# Patient Record
Sex: Female | Born: 1965 | Race: Black or African American | Hispanic: No | Marital: Married | Smoking: Never smoker
Health system: Southern US, Community
[De-identification: ages and names within clinical notes are randomized; demographics above are authoritative.]

## PROBLEM LIST (undated history)

## (undated) DIAGNOSIS — E876 Hypokalemia: Secondary | ICD-10-CM

## (undated) DIAGNOSIS — I10 Essential (primary) hypertension: Secondary | ICD-10-CM

## (undated) DIAGNOSIS — E7800 Pure hypercholesterolemia, unspecified: Secondary | ICD-10-CM

## (undated) DIAGNOSIS — E611 Iron deficiency: Secondary | ICD-10-CM

## (undated) HISTORY — PX: TUBAL LIGATION: SHX77

---

## 2009-05-13 HISTORY — PX: CARDIAC CATHETERIZATION: SHX172

## 2017-11-21 HISTORY — PX: BREAST REDUCTION SURGERY: SHX8

## 2018-01-21 ENCOUNTER — Emergency Department (HOSPITAL_COMMUNITY)
Admission: EM | Admit: 2018-01-21 | Discharge: 2018-01-21 | Disposition: A | Discharge status: Home / Self Care | Payer: Federal, State, Local not specified - PPO | Attending: Emergency Medicine | Admitting: Emergency Medicine

## 2018-01-21 ENCOUNTER — Encounter (HOSPITAL_COMMUNITY): Payer: Self-pay | Admitting: Emergency Medicine

## 2018-01-21 ENCOUNTER — Emergency Department (HOSPITAL_COMMUNITY): Payer: Federal, State, Local not specified - PPO

## 2018-01-21 DIAGNOSIS — K219 Gastro-esophageal reflux disease without esophagitis: Secondary | ICD-10-CM | POA: Diagnosis not present

## 2018-01-21 DIAGNOSIS — R112 Nausea with vomiting, unspecified: Secondary | ICD-10-CM | POA: Diagnosis present

## 2018-01-21 DIAGNOSIS — R202 Paresthesia of skin: Secondary | ICD-10-CM | POA: Diagnosis not present

## 2018-01-21 DIAGNOSIS — I10 Essential (primary) hypertension: Secondary | ICD-10-CM | POA: Insufficient documentation

## 2018-01-21 HISTORY — DX: Iron deficiency: E61.1

## 2018-01-21 HISTORY — DX: Pure hypercholesterolemia, unspecified: E78.00

## 2018-01-21 HISTORY — DX: Essential (primary) hypertension: I10

## 2018-01-21 HISTORY — DX: Hypokalemia: E87.6

## 2018-01-21 LAB — URINALYSIS, ROUTINE W REFLEX MICROSCOPIC
Bacteria, UA: NONE SEEN
Bilirubin Urine: NEGATIVE
GLUCOSE, UA: NEGATIVE mg/dL
KETONES UR: NEGATIVE mg/dL
Leukocytes, UA: NEGATIVE
NITRITE: NEGATIVE
PH: 5 (ref 5.0–8.0)
Protein, ur: NEGATIVE mg/dL
SPECIFIC GRAVITY, URINE: 1.028 (ref 1.005–1.030)

## 2018-01-21 LAB — I-STAT BETA HCG BLOOD, ED (MC, WL, AP ONLY): I-stat hCG, quantitative: <5 m[IU]/mL (ref <5)

## 2018-01-21 LAB — BASIC METABOLIC PANEL
ANION GAP: 9 (ref 5–15)
BUN: 12 mg/dL (ref 6–20)
CO2: 27 mmol/L (ref 22–32)
Calcium: 9.2 mg/dL (ref 8.9–10.3)
Chloride: 103 mmol/L (ref 98–111)
Creatinine, Ser: 0.85 mg/dL (ref 0.44–1.00)
GFR calc Af Amer: >60 mL/min (ref >60)
GLUCOSE: 80 mg/dL (ref 70–99)
POTASSIUM: 2.8 mmol/L — AB (ref 3.5–5.1)
Sodium: 139 mmol/L (ref 135–145)

## 2018-01-21 LAB — CBC
HEMATOCRIT: 33.9 % — AB (ref 36.0–46.0)
HEMOGLOBIN: 9.8 g/dL — AB (ref 12.0–15.0)
MCH: 23.9 pg — ABNORMAL LOW (ref 26.0–34.0)
MCHC: 28.9 g/dL — ABNORMAL LOW (ref 30.0–36.0)
MCV: 82.7 fL (ref 78.0–100.0)
Platelets: 323 10*3/uL (ref 150–400)
RBC: 4.1 MIL/uL (ref 3.87–5.11)
RDW: 14.5 % (ref 11.5–15.5)
WBC: 7 10*3/uL (ref 4.0–10.5)

## 2018-01-21 LAB — TROPONIN I

## 2018-01-21 MED ORDER — POTASSIUM CHLORIDE CRYS ER 20 MEQ PO TBCR: 40.0000 meq | EXTENDED_RELEASE_TABLET | Freq: Every day | ORAL | 0 refills | Status: AC

## 2018-01-21 MED ORDER — OMEPRAZOLE 20 MG PO CPDR: 20.0000 mg | DELAYED_RELEASE_CAPSULE | Freq: Every day | ORAL | 0 refills | Status: AC

## 2018-01-21 MED ORDER — POTASSIUM CHLORIDE 10 MEQ/100ML IV SOLN
10.0000 meq | Freq: Once | INTRAVENOUS | Status: AC
  Administered 2018-01-21: 10 meq via INTRAVENOUS
  Filled 2018-01-21: qty 100

## 2018-01-21 MED ORDER — POTASSIUM CHLORIDE CRYS ER 20 MEQ PO TBCR
40.0000 meq | EXTENDED_RELEASE_TABLET | Freq: Once | ORAL | Status: AC
  Administered 2018-01-21: 40 meq via ORAL
  Filled 2018-01-21: qty 2

## 2018-01-21 NOTE — ED Provider Notes (Signed)
[facility] EMERGENCY DEPARTMENT Provider Note   CSN: [id number] Arrival date & time: 01/21/18  0319     History   Chief Complaint Chief Complaint  Patient presents with  . Emesis  . Numbness    HPI Forrest Demuro is a 52 y.o. female.  HPI  This is a 52 year old female with a history of hypertension, hyperlipidemia, hypokalemia who presents with left arm tingling, nausea and vomiting.  Patient reports that she woke up at 2 AM.  She normally wakes up at 2 AM to pray.  She noted that her left arm was tingly.  Denied any weakness at the time.  She felt like she may have slept on it wrong.  She is currently staying in a hotel room for a conference.  She subsequently had onset of some burning upper abdominal and chest pain that was nonradiating.  She subsequently developed nausea and nonbilious, nonbloody emesis.  This made her nervous because she has a history of high blood pressure and high cholesterol.  No history of heart disease.  She states that she did eat a piece of pizza around 11 PM and that it may have been reflux.  She also reports that she did not take her potassium medication yesterday.  She denies any shortness of breath or cough.  She denies any fevers.  Denies history of smoking, diabetes, early family history of heart disease.  Past Medical History:  Diagnosis Date  . High cholesterol   . Hypertension   . Hypokalemia   . Low iron     There are no active problems to display for this patient.   Past Surgical History:  Procedure Laterality Date  . BREAST REDUCTION SURGERY  11/21/2017  . CARDIAC CATHETERIZATION  2011  . TUBAL LIGATION       OB History   None      Home Medications    Prior to Admission medications   Medication Sig Start Date End Date Taking? Authorizing Provider  omeprazole (PRILOSEC) 20 MG capsule Take 1 capsule (20 mg total) by mouth daily. 01/21/18   Horton, Mayer Masker, MD  potassium chloride SA (K-DUR,KLOR-CON) 20 MEQ  tablet Take 2 tablets (40 mEq total) by mouth daily. 01/21/18   Horton, Mayer Masker, MD    Family History No family history on file.  Social History Social History   Tobacco Use  . Smoking status: Never Smoker  . Smokeless tobacco: Never Used  Substance Use Topics  . Alcohol use: Not Currently  . Drug use: Not Currently     Allergies   Amoxicillin   Review of Systems Review of Systems  Constitutional: Negative for fever.  Respiratory: Negative for shortness of breath.   Cardiovascular: Positive for chest pain.  Gastrointestinal: Positive for abdominal pain. Negative for diarrhea, nausea and vomiting.  Genitourinary: Negative for dysuria.  Neurological: Positive for numbness.  All other systems reviewed and are negative.    Physical Exam Updated Vital Signs BP 140/78   Pulse 68   Temp 98.8 F (37.1 C) (Oral)   Resp 18   Ht 1.626 m (5\' 4" )   Wt 81.6 kg   LMP 01/19/2018   SpO2 100%   BMI 30.90 kg/m   Physical Exam  Constitutional: She is oriented to person, place, and time. She appears well-developed and well-nourished.  HENT:  Head: Normocephalic and atraumatic.  Neck: Neck supple.  Cardiovascular: Normal rate, regular rhythm and normal heart sounds.  Pulmonary/Chest: Effort normal and breath sounds normal.  No respiratory distress. She has no wheezes.  Abdominal: Soft. Bowel sounds are normal. There is no tenderness.  Musculoskeletal: She exhibits no edema.  Neurological: She is alert and oriented to person, place, and time.  Fluent speech, cranial nerves II through XII intact, 5 out of 5 strength in all 4 extremities, no dysmetria to finger-nose-finger  Skin: Skin is warm and dry.  Psychiatric: She has a normal mood and affect.  Nursing note and vitals reviewed.    ED Treatments / Results  Labs (all labs ordered are listed, but only abnormal results are displayed) Labs Reviewed  BASIC METABOLIC PANEL - Abnormal; Notable for the following components:        Result Value   Potassium 2.8 (*)    All other components within normal limits  CBC - Abnormal; Notable for the following components:   Hemoglobin 9.8 (*)    HCT 33.9 (*)    MCH 23.9 (*)    MCHC 28.9 (*)    All other components within normal limits  URINALYSIS, ROUTINE W REFLEX MICROSCOPIC - Abnormal; Notable for the following components:   APPearance HAZY (*)    Hgb urine dipstick LARGE (*)    All other components within normal limits  TROPONIN I  I-STAT BETA HCG BLOOD, ED ([facility], [facility], [facility] ONLY)    EKG EKG Interpretation  Date/Time:  Wednesday January 21 2018 03:25:15 EDT Ventricular Rate:  72 PR Interval:  176 QRS Duration: 92 QT Interval:  356 QTC Calculation: 389 R Axis:   28 Text Interpretation:  Normal sinus rhythm Nonspecific T wave abnormality Abnormal ECG Confirmed by Ross Marcus ([id number]) on 01/21/2018 4:29:14 AM   Radiology Dg Abdomen Acute W/chest  Result Date: 01/21/2018 CLINICAL DATA:  Vomiting.  Left arm tingling. EXAM: DG ABDOMEN ACUTE W/ 1V CHEST COMPARISON:  None. FINDINGS: The cardiomediastinal contours are normal. The lungs are clear. There is no free intra-abdominal air. No dilated bowel loops to suggest obstruction. Moderate stool burden in the proximal colon, small volume of stool distally. Multiple pelvic phleboliths without evidence of radiopaque calculi. Bilateral tubal ligation clips in the pelvis. No acute osseous abnormalities are seen. IMPRESSION: 1. Normal bowel gas pattern.  No obstruction or free air. 2.  No acute pulmonary process. Electronically Signed   By: Narda Rutherford M.D.   On: 01/21/2018 05:11    Procedures Procedures (including critical care time)  Medications Ordered in ED Medications  potassium chloride 10 mEq in 100 mL IVPB (10 mEq Intravenous New Bag/Given 01/21/18 0513)  potassium chloride SA (K-DUR,KLOR-CON) CR tablet 40 mEq (40 mEq Oral Given 01/21/18 0515)     Initial Impression / Assessment and Plan / ED Course  I  have reviewed the triage vital signs and the nursing notes.  Pertinent labs & imaging results that were available during my care of the patient were reviewed by me and considered in my medical decision making (see chart for details).     She presents with left arm numbness resolved as well as some burning chest pain.  She is overall nontoxic-appearing.  Vital signs reassuring.  Heart score of 2.  Very atypical for ACS.  EKG without evidence of ischemia.  Troponin negative.  She is neurovascularly intact.  No residual numbness.  Has a history of hypokalemia.  Potassium here 2.8.  This was replaced.  Patient is requesting discharge.  Chest x-ray shows no evidence of pneumothorax or pneumonia.  She does have evidence of anemia but is asymptomatic with normal vital  signs.  After history, exam, and medical workup I feel the patient has been appropriately medically screened and is safe for discharge home. Pertinent diagnoses were discussed with the patient. Patient was given return precautions.   Final Clinical Impressions(s) / ED Diagnoses   Final diagnoses:  Paresthesia  Gastroesophageal reflux disease, esophagitis presence not specified    ED Discharge Orders         Ordered    omeprazole (PRILOSEC) 20 MG capsule  Daily     01/21/18 0557    potassium chloride SA (K-DUR,KLOR-CON) 20 MEQ tablet  Daily     01/21/18 0557           Shon Baton, MD 01/21/18 0600

## 2018-01-21 NOTE — ED Triage Notes (Signed)
BIB EMS from hotel where pt is currently staying at for a conference. Pt woke up at 0200 to pray, she noticed her L arm was numb/tingling, pt then developed nausea and a burning sensation in her throat. Pt currently has no complaints, neuro intact in triage. Received 4mg  Zofran en route, nausea has subsided.

## 2019-11-30 IMAGING — CR DG ABDOMEN ACUTE W/ 1V CHEST
3 series · 3 of 3 positions shown · non-contrast
Comparison: None.

CLINICAL DATA: Vomiting.  Left arm tingling.

EXAM:
DG ABDOMEN ACUTE W/ 1V CHEST

[chest pa]
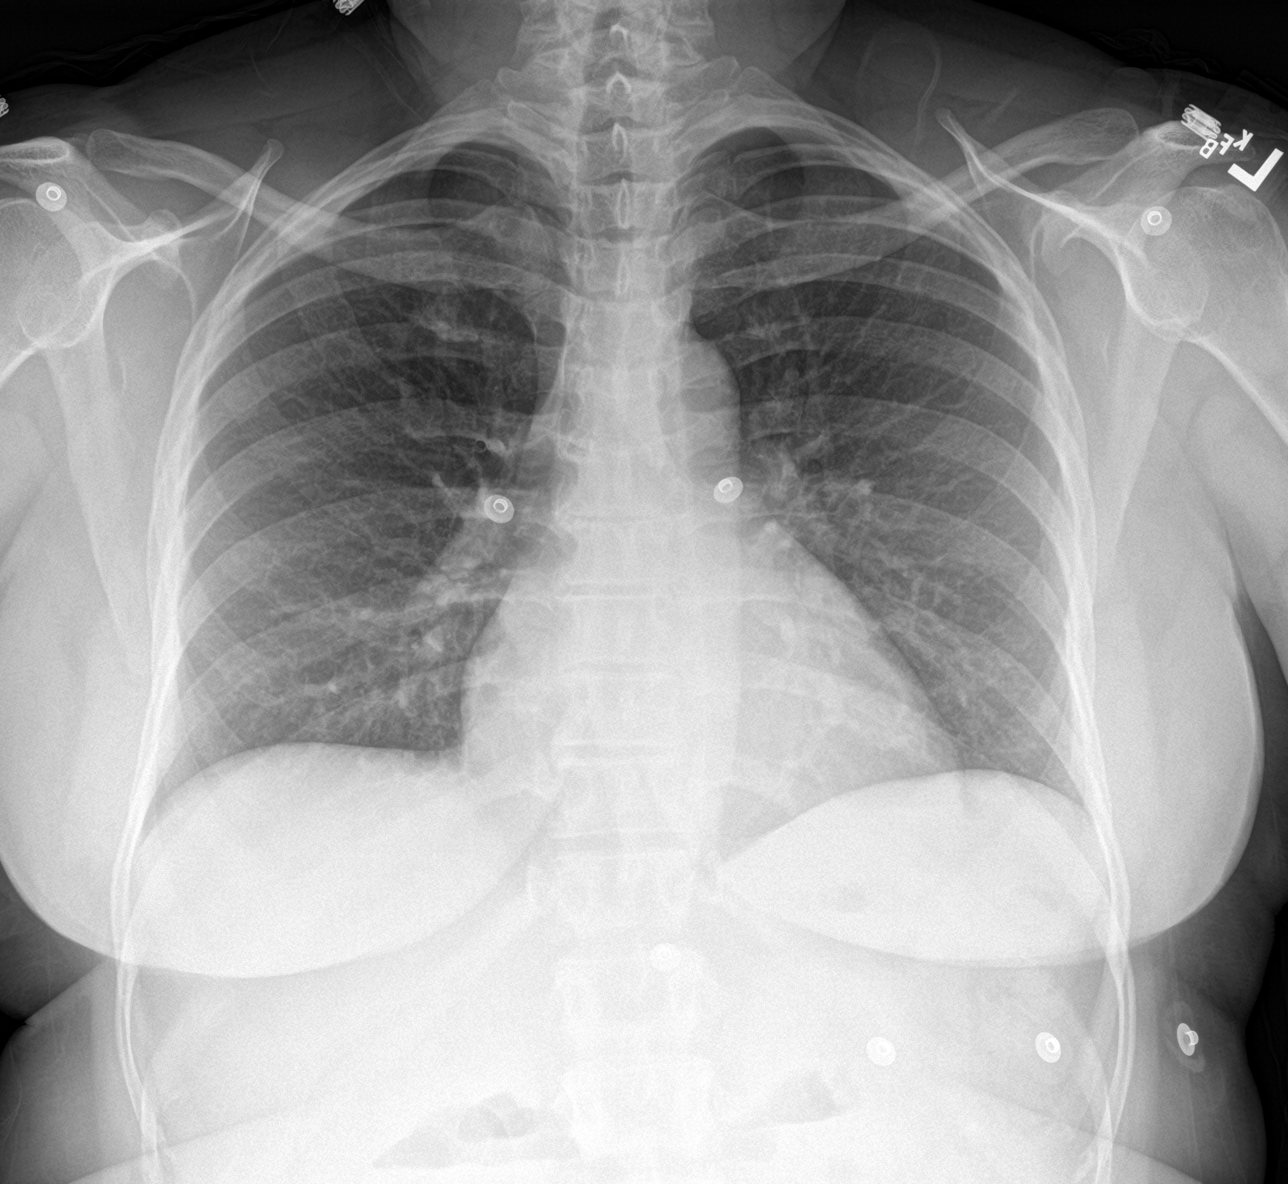

[abdomen erect]
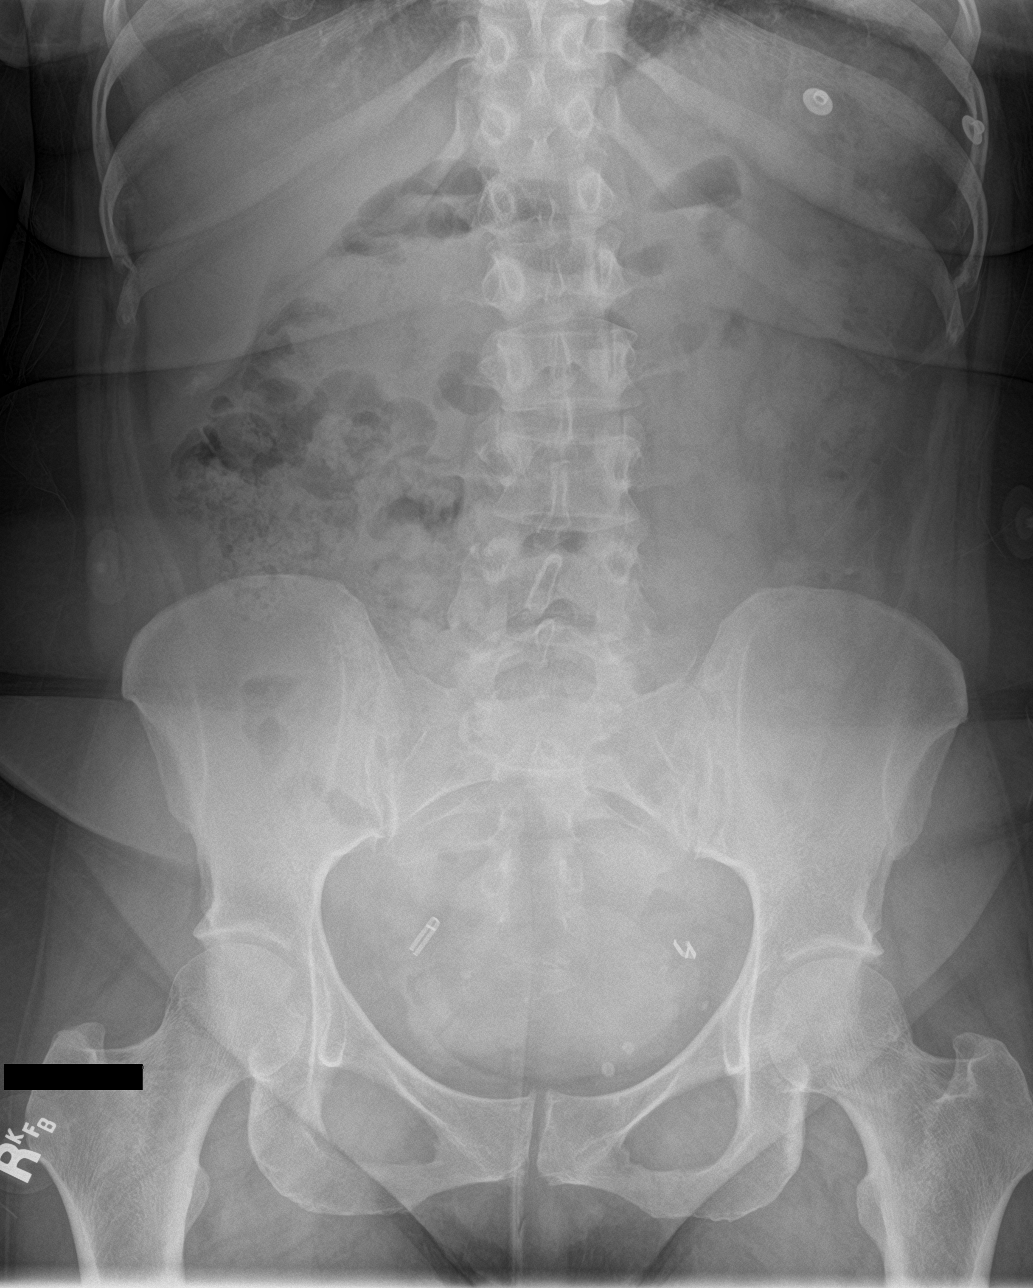

[abdomen supine]
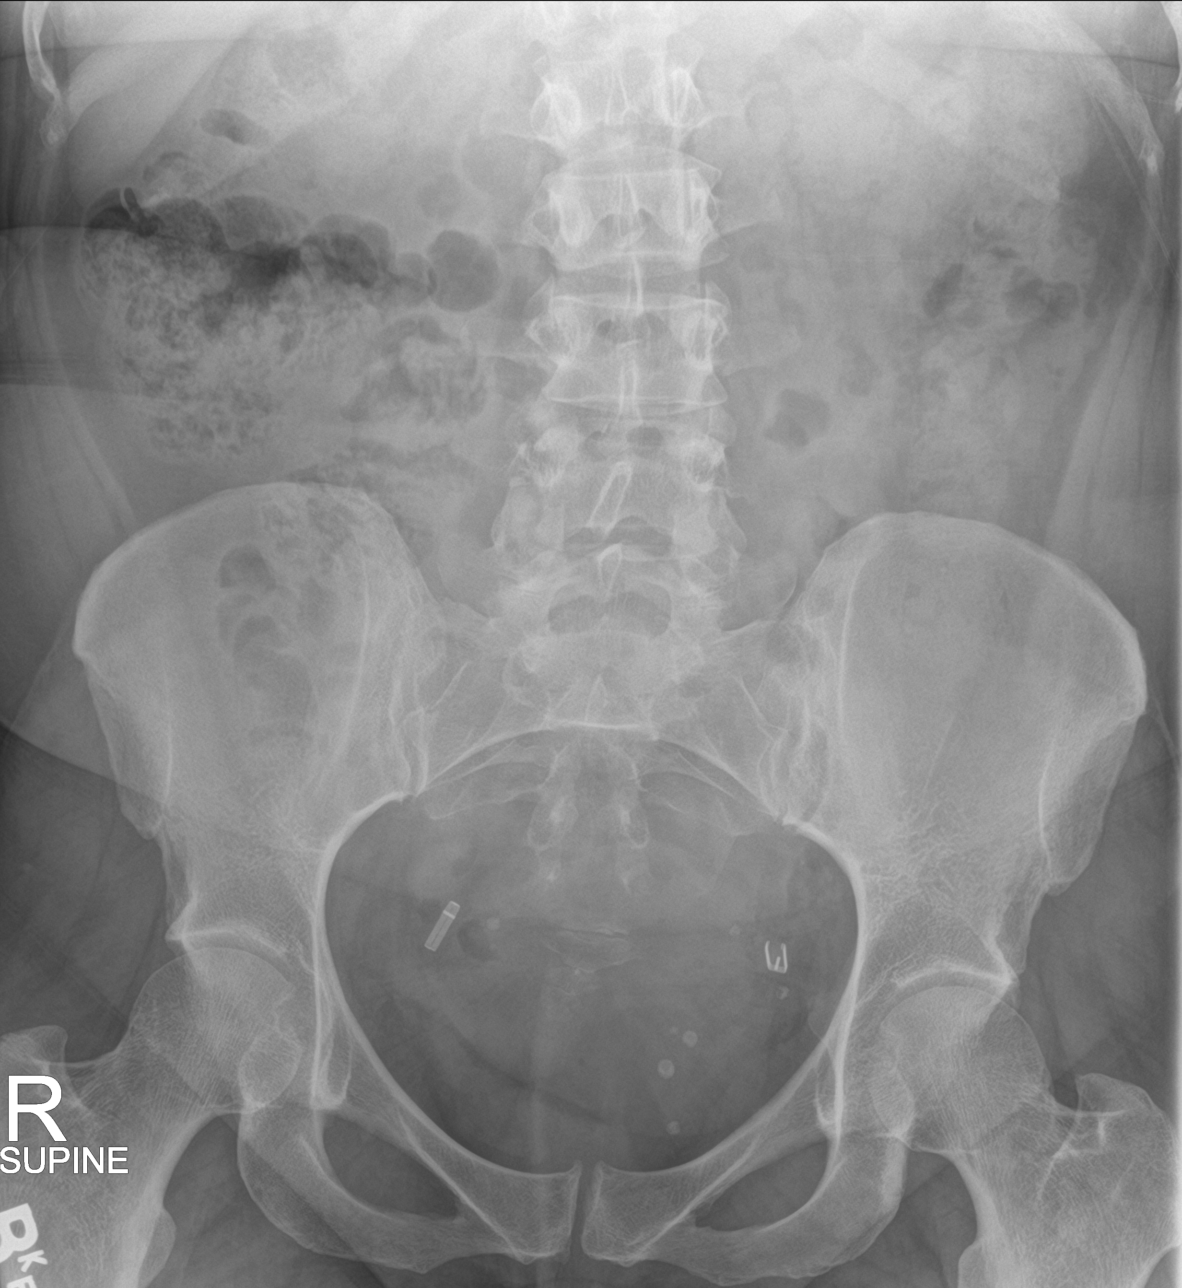

[3 of 3 positions shown; findings below may reference images not displayed]

FINDINGS: The cardiomediastinal contours are normal. The lungs are clear.
There is no free intra-abdominal air. No dilated bowel loops to
suggest obstruction. Moderate stool burden in the proximal colon,
small volume of stool distally. Multiple pelvic phleboliths without
evidence of radiopaque calculi. Bilateral tubal ligation clips in
the pelvis. No acute osseous abnormalities are seen.
IMPRESSION: 1. Normal bowel gas pattern.  No obstruction or free air.
2.  No acute pulmonary process.
